# Patient Record
Sex: Female | Born: 1980
Health system: Southern US, Community
[De-identification: ages and names within clinical notes are randomized; demographics above are authoritative.]

## PROBLEM LIST (undated history)

## (undated) DIAGNOSIS — F329 Major depressive disorder, single episode, unspecified: Secondary | ICD-10-CM

## (undated) DIAGNOSIS — F419 Anxiety disorder, unspecified: Secondary | ICD-10-CM

## (undated) DIAGNOSIS — F32A Depression, unspecified: Secondary | ICD-10-CM

## (undated) DIAGNOSIS — R51 Headache: Secondary | ICD-10-CM

## (undated) HISTORY — PX: NO PAST SURGERIES: SHX2092

---
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
## 2001-08-10 ENCOUNTER — Inpatient Hospital Stay (HOSPITAL_COMMUNITY): Admission: AD | Admit: 2001-08-10 | Discharge: 2001-08-12 | Payer: Self-pay

## 2004-01-12 ENCOUNTER — Ambulatory Visit: Payer: Self-pay | Admitting: Family Medicine

## 2006-04-03 ENCOUNTER — Other Ambulatory Visit: Admission: RE | Admit: 2006-04-03 | Discharge: 2006-04-03 | Payer: Self-pay | Admitting: Family Medicine

## 2007-07-09 ENCOUNTER — Other Ambulatory Visit: Admission: RE | Admit: 2007-07-09 | Discharge: 2007-07-09 | Payer: Self-pay | Admitting: Family Medicine

## 2009-04-22 ENCOUNTER — Other Ambulatory Visit: Admission: RE | Admit: 2009-04-22 | Discharge: 2009-04-22 | Payer: Self-pay | Admitting: Family Medicine

## 2010-07-27 ENCOUNTER — Other Ambulatory Visit (HOSPITAL_COMMUNITY): Payer: Self-pay | Admitting: Obstetrics and Gynecology

## 2010-07-29 ENCOUNTER — Ambulatory Visit (HOSPITAL_COMMUNITY)
Admission: RE | Admit: 2010-07-29 | Discharge: 2010-07-29 | Disposition: A | Discharge status: Home / Self Care | Payer: Commercial Indemnity | Source: Ambulatory Visit | Attending: Obstetrics and Gynecology | Admitting: Obstetrics and Gynecology

## 2010-07-29 DIAGNOSIS — Z3689 Encounter for other specified antenatal screening: Secondary | ICD-10-CM | POA: Insufficient documentation

## 2010-07-29 DIAGNOSIS — O365990 Maternal care for other known or suspected poor fetal growth, unspecified trimester, not applicable or unspecified: Secondary | ICD-10-CM | POA: Insufficient documentation

## 2010-09-27 NOTE — Patient Instructions (Addendum)
21 Connie Frazier  09/27/2010   Your procedure is scheduled on:  Thursday  Report to [facility] at 1030 AM.  Call this number if you have problems the morning of surgery: [phone]   Remember:   Do not eat food:After Midnight.  Do not drink clear liquids: 4 Hours before arrival.  Take these medicines the morning of surgery with A SIP OF WATER:NA   Do not wear jewelry, make-up or nail polish.  Do not bring valuables to the hospital.  Contacts, dentures or bridgework may not be worn into surgery.  Leave suitcase in the car. After surgery it may be brought to your room.  For patients admitted to the hospital, checkout time is 11:00 AM the day of discharge.   Patients discharged the day of surgery will not be allowed to drive home.  Name and phone number of your driver:NA  Special Instructions  Please read over the following fact sheets that you were given:MRSA

## 2010-09-28 ENCOUNTER — Encounter (HOSPITAL_COMMUNITY): Payer: Self-pay

## 2010-09-28 ENCOUNTER — Encounter (HOSPITAL_COMMUNITY)
Admission: RE | Admit: 2010-09-28 | Discharge: 2010-09-28 | Disposition: A | Discharge status: Home / Self Care | Payer: Managed Care, Other (non HMO) | Source: Ambulatory Visit | Attending: Obstetrics and Gynecology | Admitting: Obstetrics and Gynecology

## 2010-09-28 HISTORY — DX: Anxiety disorder, unspecified: F41.9

## 2010-09-28 HISTORY — DX: Major depressive disorder, single episode, unspecified: F32.9

## 2010-09-28 HISTORY — DX: Headache: R51

## 2010-09-28 HISTORY — DX: Depression, unspecified: F32.A

## 2010-09-28 LAB — SURGICAL PCR SCREEN: Staphylococcus aureus: NEGATIVE

## 2010-09-28 LAB — CBC
MCV: 74.8 fL — ABNORMAL LOW (ref 78.0–100.0)
Platelets: 156 10*3/uL (ref 150–400)
RBC: 4.97 MIL/uL (ref 3.87–5.11)
RDW: 14.1 % (ref 11.5–15.5)
WBC: 16 10*3/uL — ABNORMAL HIGH (ref 4.0–10.5)

## 2010-09-30 ENCOUNTER — Inpatient Hospital Stay (HOSPITAL_COMMUNITY): Payer: Managed Care, Other (non HMO) | Admitting: Anesthesiology

## 2010-09-30 ENCOUNTER — Encounter (HOSPITAL_COMMUNITY): Payer: Self-pay | Admitting: Anesthesiology

## 2010-09-30 ENCOUNTER — Encounter (HOSPITAL_COMMUNITY): Payer: Self-pay | Admitting: *Deleted

## 2010-09-30 ENCOUNTER — Inpatient Hospital Stay (HOSPITAL_COMMUNITY)
Admission: RE | Admit: 2010-09-30 | Discharge: 2010-10-02 | DRG: 766 | Disposition: A | Discharge status: Home / Self Care | Payer: Managed Care, Other (non HMO) | Source: Ambulatory Visit | Attending: Obstetrics and Gynecology | Admitting: Obstetrics and Gynecology

## 2010-09-30 ENCOUNTER — Encounter (HOSPITAL_COMMUNITY)
Admission: RE | Disposition: A | Discharge status: Home / Self Care | Payer: Self-pay | Source: Ambulatory Visit | Attending: Obstetrics and Gynecology

## 2010-09-30 DIAGNOSIS — Z01818 Encounter for other preprocedural examination: Secondary | ICD-10-CM

## 2010-09-30 DIAGNOSIS — Z01812 Encounter for preprocedural laboratory examination: Secondary | ICD-10-CM

## 2010-09-30 DIAGNOSIS — O321XX0 Maternal care for breech presentation, not applicable or unspecified: Principal | ICD-10-CM | POA: Diagnosis present

## 2010-09-30 LAB — ABO/RH

## 2010-09-30 SURGERY — Surgical Case
Anesthesia: Regional | Wound class: Clean Contaminated

## 2010-09-30 MED ORDER — OXYTOCIN 10 UNIT/ML IJ SOLN
INTRAMUSCULAR | Status: AC
  Filled 2010-09-30: qty 2

## 2010-09-30 MED ORDER — IBUPROFEN 200 MG PO TABS: 200.0000 mg | ORAL_TABLET | Freq: Four times a day (QID) | ORAL | Status: DC | PRN

## 2010-09-30 MED ORDER — OXYTOCIN 20 UNITS IN LACTATED RINGERS INFUSION - SIMPLE
INTRAVENOUS | Status: DC | PRN
  Administered 2010-09-30 (×2): 20 [IU] via INTRAVENOUS

## 2010-09-30 MED ORDER — KETOROLAC TROMETHAMINE 30 MG/ML IJ SOLN
INTRAMUSCULAR | Status: AC
  Administered 2010-09-30: 30 mg via INTRAVENOUS
  Filled 2010-09-30: qty 1

## 2010-09-30 MED ORDER — LACTATED RINGERS IV SOLN
INTRAVENOUS | Status: DC
  Administered 2010-09-30: 20:00:00 via INTRAVENOUS

## 2010-09-30 MED ORDER — KETOROLAC TROMETHAMINE 30 MG/ML IJ SOLN
15.0000 mg | Freq: Once | INTRAMUSCULAR | Status: AC | PRN
  Administered 2010-09-30: 30 mg via INTRAVENOUS

## 2010-09-30 MED ORDER — LACTATED RINGERS IV SOLN
INTRAVENOUS | Status: DC
  Administered 2010-09-30: 1000 mL via INTRAVENOUS
  Administered 2010-09-30 (×2): via INTRAVENOUS
  Administered 2010-09-30: 1000 mL via INTRAVENOUS

## 2010-09-30 MED ORDER — OXYTOCIN 20 UNITS IN LACTATED RINGERS INFUSION - SIMPLE: 125.0000 mL/h | INTRAVENOUS | Status: AC

## 2010-09-30 MED ORDER — SENNOSIDES-DOCUSATE SODIUM 8.6-50 MG PO TABS
1.0000 | ORAL_TABLET | Freq: Every day | ORAL | Status: DC
  Administered 2010-10-01: 1 via ORAL

## 2010-09-30 MED ORDER — MEPERIDINE HCL 25 MG/ML IJ SOLN: 6.2500 mg | INTRAMUSCULAR | Status: DC | PRN

## 2010-09-30 MED ORDER — OXYCODONE-ACETAMINOPHEN 5-325 MG PO TABS
1.0000 | ORAL_TABLET | ORAL | Status: DC | PRN
  Administered 2010-10-01: 2 via ORAL
  Administered 2010-10-01: 1 via ORAL
  Administered 2010-10-01 – 2010-10-02 (×3): 2 via ORAL
  Filled 2010-09-30 (×2): qty 2
  Filled 2010-09-30: qty 1
  Filled 2010-09-30 (×2): qty 2

## 2010-09-30 MED ORDER — PHENYLEPHRINE HCL 10 MG/ML IJ SOLN
INTRAMUSCULAR | Status: DC | PRN
  Administered 2010-09-30 (×2): 80 ug via INTRAVENOUS
  Administered 2010-09-30 (×3): 40 ug via INTRAVENOUS
  Administered 2010-09-30 (×3): 80 ug via INTRAVENOUS
  Administered 2010-09-30: 40 ug via INTRAVENOUS

## 2010-09-30 MED ORDER — EPHEDRINE SULFATE 50 MG/ML IJ SOLN
INTRAMUSCULAR | Status: DC | PRN
  Administered 2010-09-30 (×4): 5 mg via INTRAVENOUS
  Administered 2010-09-30: 10 mg via INTRAVENOUS
  Administered 2010-09-30: 5 mg via INTRAVENOUS

## 2010-09-30 MED ORDER — PRENATAL PLUS 27-1 MG PO TABS
1.0000 | ORAL_TABLET | Freq: Every day | ORAL | Status: DC
  Administered 2010-10-02: 1 via ORAL
  Filled 2010-09-30: qty 1

## 2010-09-30 MED ORDER — CEFAZOLIN SODIUM 1-5 GM-% IV SOLN
INTRAVENOUS | Status: AC
  Administered 2010-09-30: 1 g via INTRAVENOUS
  Filled 2010-09-30: qty 50

## 2010-09-30 MED ORDER — SCOPOLAMINE 1 MG/3DAYS TD PT72
1.0000 | MEDICATED_PATCH | TRANSDERMAL | Status: DC
  Administered 2010-09-30: 1.5 mg via TRANSDERMAL

## 2010-09-30 MED ORDER — MENTHOL 3 MG MT LOZG: 1.0000 | LOZENGE | OROMUCOSAL | Status: DC | PRN

## 2010-09-30 MED ORDER — MORPHINE SULFATE 0.5 MG/ML IJ SOLN
INTRAMUSCULAR | Status: AC
  Filled 2010-09-30: qty 10

## 2010-09-30 MED ORDER — SCOPOLAMINE 1 MG/3DAYS TD PT72
MEDICATED_PATCH | TRANSDERMAL | Status: AC
  Filled 2010-09-30: qty 1

## 2010-09-30 MED ORDER — ONDANSETRON HCL 4 MG/2ML IJ SOLN
INTRAMUSCULAR | Status: AC
  Filled 2010-09-30: qty 2

## 2010-09-30 MED ORDER — FERROUS SULFATE 325 (65 FE) MG PO TABS
325.0000 mg | ORAL_TABLET | Freq: Two times a day (BID) | ORAL | Status: DC
  Administered 2010-10-01 – 2010-10-02 (×2): 325 mg via ORAL
  Filled 2010-09-30 (×2): qty 1

## 2010-09-30 MED ORDER — PHENYLEPHRINE 40 MCG/ML (10ML) SYRINGE FOR IV PUSH (FOR BLOOD PRESSURE SUPPORT)
PREFILLED_SYRINGE | INTRAVENOUS | Status: AC
  Filled 2010-09-30: qty 5

## 2010-09-30 MED ORDER — IBUPROFEN 600 MG PO TABS
600.0000 mg | ORAL_TABLET | Freq: Four times a day (QID) | ORAL | Status: DC
  Administered 2010-10-01 – 2010-10-02 (×4): 600 mg via ORAL
  Filled 2010-09-30 (×5): qty 1

## 2010-09-30 MED ORDER — FENTANYL CITRATE 0.05 MG/ML IJ SOLN
INTRAMUSCULAR | Status: AC
  Filled 2010-09-30: qty 2

## 2010-09-30 MED ORDER — ZOLPIDEM TARTRATE 5 MG PO TABS: 5.0000 mg | ORAL_TABLET | Freq: Every evening | ORAL | Status: DC | PRN

## 2010-09-30 MED ORDER — KETOROLAC TROMETHAMINE 30 MG/ML IJ SOLN
30.0000 mg | Freq: Four times a day (QID) | INTRAMUSCULAR | Status: DC | PRN
  Administered 2010-09-30 – 2010-10-01 (×2): 30 mg via INTRAVENOUS
  Filled 2010-09-30 (×2): qty 1

## 2010-09-30 MED ORDER — SIMETHICONE 80 MG PO CHEW
80.0000 mg | CHEWABLE_TABLET | Freq: Three times a day (TID) | ORAL | Status: DC
  Administered 2010-10-01 – 2010-10-02 (×5): 80 mg via ORAL

## 2010-09-30 MED ORDER — ONDANSETRON HCL 4 MG/2ML IJ SOLN: 4.0000 mg | INTRAMUSCULAR | Status: DC | PRN

## 2010-09-30 MED ORDER — HYDROMORPHONE HCL 1 MG/ML IJ SOLN
0.2500 mg | INTRAMUSCULAR | Status: DC | PRN
  Administered 2010-09-30: 0.5 mg via INTRAVENOUS

## 2010-09-30 MED ORDER — HYDROMORPHONE HCL 1 MG/ML IJ SOLN
INTRAMUSCULAR | Status: AC
  Administered 2010-09-30: 0.5 mg via INTRAVENOUS
  Filled 2010-09-30: qty 1

## 2010-09-30 MED ORDER — WITCH HAZEL-GLYCERIN EX PADS: MEDICATED_PAD | CUTANEOUS | Status: DC | PRN

## 2010-09-30 MED ORDER — SIMETHICONE 80 MG PO CHEW: 80.0000 mg | CHEWABLE_TABLET | ORAL | Status: DC | PRN

## 2010-09-30 MED ORDER — DIPHENHYDRAMINE HCL 25 MG PO CAPS: 25.0000 mg | ORAL_CAPSULE | Freq: Four times a day (QID) | ORAL | Status: DC | PRN

## 2010-09-30 MED ORDER — ONDANSETRON HCL 4 MG/2ML IJ SOLN: 4.0000 mg | Freq: Once | INTRAMUSCULAR | Status: DC | PRN

## 2010-09-30 MED ORDER — ONDANSETRON HCL 4 MG PO TABS: 4.0000 mg | ORAL_TABLET | ORAL | Status: DC | PRN

## 2010-09-30 MED ORDER — ONDANSETRON HCL 4 MG/2ML IJ SOLN
INTRAMUSCULAR | Status: DC | PRN
  Administered 2010-09-30: 4 mg via INTRAVENOUS

## 2010-09-30 SURGICAL SUPPLY — 38 items
APL SKNCLS STERI-STRIP NONHPOA (GAUZE/BANDAGES/DRESSINGS) ×1
BENZOIN TINCTURE PRP APPL 2/3 (GAUZE/BANDAGES/DRESSINGS) ×2 IMPLANT
CLOTH BEACON ORANGE TIMEOUT ST (SAFETY) ×2 IMPLANT
CONTAINER PREFILL 10% NBF 15ML (MISCELLANEOUS) IMPLANT
DRAPE UTILITY XL STRL (DRAPES) ×2 IMPLANT
DRESSING TELFA 8X3 (GAUZE/BANDAGES/DRESSINGS) ×2 IMPLANT
ELECT REM PT RETURN 9FT ADLT (ELECTROSURGICAL) ×2
ELECTRODE REM PT RTRN 9FT ADLT (ELECTROSURGICAL) ×1 IMPLANT
EXTRACTOR VACUUM M CUP 4 TUBE (SUCTIONS) IMPLANT
GAUZE SPONGE 4X4 12PLY STRL LF (GAUZE/BANDAGES/DRESSINGS) ×2 IMPLANT
GLOVE BIOGEL M 6.5 STRL (GLOVE) ×2 IMPLANT
GLOVE BIOGEL PI IND STRL 6.5 (GLOVE) ×2 IMPLANT
GLOVE BIOGEL PI INDICATOR 6.5 (GLOVE) ×2
GOWN PREVENTION PLUS LG XLONG (DISPOSABLE) ×2 IMPLANT
GOWN PREVENTION PLUS XLARGE (GOWN DISPOSABLE) ×2 IMPLANT
KIT ABG SYR 3ML LUER SLIP (SYRINGE) IMPLANT
NDL HYPO 25X5/8 SAFETYGLIDE (NEEDLE) IMPLANT
NEEDLE HYPO 25X5/8 SAFETYGLIDE (NEEDLE) IMPLANT
NS IRRIG 1000ML POUR BTL (IV SOLUTION) ×2 IMPLANT
PACK C SECTION WH (CUSTOM PROCEDURE TRAY) ×2 IMPLANT
PAD ABD 7.5X8 STRL (GAUZE/BANDAGES/DRESSINGS) ×2 IMPLANT
RTRCTR C-SECT PINK 25CM LRG (MISCELLANEOUS) ×1 IMPLANT
RTRCTR C-SECT PINK 34CM XLRG (MISCELLANEOUS) IMPLANT
SLEEVE SCD COMPRESS KNEE MED (MISCELLANEOUS) IMPLANT
STAPLER VISISTAT 35W (STAPLE) IMPLANT
STRIP CLOSURE SKIN 1/2X4 (GAUZE/BANDAGES/DRESSINGS) ×2 IMPLANT
SUT PDS AB 0 CT1 27 (SUTURE) ×4 IMPLANT
SUT PLAIN 0 NONE (SUTURE) IMPLANT
SUT VIC AB 0 CTX 36 (SUTURE) ×6
SUT VIC AB 0 CTX36XBRD ANBCTRL (SUTURE) ×3 IMPLANT
SUT VIC AB 2-0 CT1 27 (SUTURE) ×2
SUT VIC AB 2-0 CT1 TAPERPNT 27 (SUTURE) ×1 IMPLANT
SUT VIC AB 3-0 SH 27 (SUTURE)
SUT VIC AB 3-0 SH 27X BRD (SUTURE) IMPLANT
SUT VIC AB 4-0 KS 27 (SUTURE) ×2 IMPLANT
TOWEL OR 17X24 6PK STRL BLUE (TOWEL DISPOSABLE) ×4 IMPLANT
TRAY FOLEY CATH 14FR (SET/KITS/TRAYS/PACK) ×2 IMPLANT
WATER STERILE IRR 1000ML POUR (IV SOLUTION) ×2 IMPLANT

## 2010-09-30 NOTE — Anesthesia Preprocedure Evaluation (Signed)
Anesthesia Evaluation  Name, MR# and DOB Patient awake  General Assessment Comment  Reviewed: Allergy & Precautions, H&P  and Patient's Chart, lab work & pertinent test results  Airway Mallampati: II TM Distance: >3 FB Neck ROM: full    Dental No notable dental hx    Pulmonaryneg pulmonary ROS      pulmonary exam normal   Cardiovascular regular Normal   Neuro/PsychNegative Psych ROS  GI/Hepatic/Renal negative GI ROS, negative Liver ROS, and negative Renal ROS (+)       Endo/Other  Negative Endocrine ROS (+)   Abdominal   Musculoskeletal  Hematology negative hematology ROS (+)   Peds  Reproductive/Obstetrics (+) Pregnancy   Anesthesia Other Findings             Anesthesia Physical Anesthesia Plan  ASA: II  Anesthesia Plan: Spinal   Post-op Pain Management:    Induction:   Airway Management Planned:   Additional Equipment:   Intra-op Plan:   Post-operative Plan:   Informed Consent: I have reviewed the patients History and Physical, chart, labs and discussed the procedure including the risks, benefits and alternatives for the proposed anesthesia with the patient or authorized representative who has indicated his/her understanding and acceptance.     Plan Discussed with: CRNA  Anesthesia Plan Comments:         Anesthesia Quick Evaluation

## 2010-09-30 NOTE — Transfer of Care (Signed)
Immediate Anesthesia Transfer of Care Note  Patient: Connie Frazier  Procedure(s) Performed:  CESAREAN SECTION - Primary Cesarean Section  Patient Location: PACU  Anesthesia Type: Spinal  Level of Consciousness: awake, alert  and oriented  Airway & Oxygen Therapy: Patient Spontanous Breathing  Post-op Asssessment:  No anesthesia complications  Post vital signs: Reviewed and stable  Complications: No apparent anesthesia complications

## 2010-09-30 NOTE — Consult Note (Signed)
Delivery Note   09/30/2010  1:39 PM  Requested by Dr. Richardson Dopp   to attend this C-section for breech presentation.    Born to a 29  y/o G2P1 mother with [facility]  and negative screens.          Prenatal problems included breech presentation.  AROM at delivery with clear fluid.             The c/section delivery was uncomplicated otherwise.  Infant handed to Neo crying.  Dried, bulb suctioned and kept warm.  APGAR 9 and 9.  Care transfer to Dr. Carmon Ginsberg.    Chales Abrahams V.T. Axell Trigueros, MD Neonatologist

## 2010-09-30 NOTE — Op Note (Signed)
Cesarean Section Procedure Note  Indications: malpresentation: breech  Pre-operative Diagnosis: 39 week 1 day pregnancy.  Post-operative Diagnosis: same  Surgeon: Jessee Avers.   Assistants: none  Anesthesia: Spinal anesthesia  ASA Class: 2  Procedure Details  The patient was seen in the Holding Room. The risks, benefits, complications, treatment options, and expected outcomes were discussed with the patient.  The patient concurred with the proposed plan, giving informed consent.  The site of surgery properly noted/marked. The patient was taken to Operating Room # 9 , identified as Cathlean Marseilles and the procedure verified as C-Section Delivery. A Time Out was held and the above information confirmed.  After induction of anesthesia, the patient was draped and prepped in the usual sterile manner. A Pfannenstiel incision was made and carried down through the subcutaneous tissue to the fascia. Fascial incision was made and extended transversely. The fascia was separated from the underlying rectus tissue superiorly and inferiorly. The peritoneum was identified and entered. Peritoneal incision was extended longitudinally. The utero-vesical peritoneal reflection was incised transversely and the bladder flap was bluntly freed from the lower uterine segment. A low transverse uterine incision was made. Delivered from breech  presentation was a female infant with Apgar scores of 9 at one minute and 9 at five minutes. After the umbilical cord was clamped and cut cord blood was obtained for evaluation. The placenta was removed intact and appeared normal. The uterine outline, tubes and ovaries appeared normal. The uterine incision was closed with running locked sutures of 0 Vicryl.  A second layer of suture was used and Hemostasis was observed. Lavage was carried out until clear. The fascia was then reapproximated with running sutures of 0 PDS The skin was reapproximated with 4-0 vicryl  Instrument, sponge, and  needle counts were correct prior the abdominal closure and at the conclusion of the case.   Findings: Female infant breech presentation apgars 9/9 .Marland Kitchen Normal tubes and ovaries   Estimated Blood Loss:  400         Drains: none         Total IV Fluids: per anesthesia ml         Specimens: placenta to labor and delivery          Implants: foley         Complications:  None; patient tolerated the procedure well.         Disposition: PACU - hemodynamically stable.         Condition: stable  Attending Attestation: I performed the procedure.

## 2010-09-30 NOTE — Anesthesia Procedure Notes (Addendum)
Spinal Block  Patient location during procedure: OR Start time: 09/30/2010 12:50 PM End time: 09/30/2010 1:02 PM Staffing Anesthesiologist: Sandrea Hughs Performed by: anesthesiologist  Preanesthetic Checklist Completed: patient identified, site marked, surgical consent, pre-op evaluation, timeout performed, IV checked, risks and benefits discussed and monitors and equipment checked Spinal Block Patient position: sitting Prep: DuraPrep Patient monitoring: heart rate, cardiac monitor, continuous pulse ox and blood pressure Approach: midline Location: L3-4 Injection technique: single-shot Needle Needle type: Sprotte  Needle gauge: 24 G Needle length: 9 cm Needle insertion depth: 5 cm Assessment Sensory level: T4 Events: paresthesia Additional Notes It took multiple attempts due to scoliosis. Pt had a left leg paresthesia x 2. I used 10.5 mg of bupivicaine, of fentanyl, and 0.1 mg of duramorph

## 2010-09-30 NOTE — Anesthesia Postprocedure Evaluation (Signed)
Vital signs stable Patient alert Pain and nausea are controlled No apparent anesthetic complications No follow up care needed Pt may be d/c when nm fxn returns 

## 2010-10-01 LAB — CBC
HCT: 29.6 % — ABNORMAL LOW (ref 36.0–46.0)
Hemoglobin: 9.2 g/dL — ABNORMAL LOW (ref 12.0–15.0)
MCH: 23.2 pg — ABNORMAL LOW (ref 26.0–34.0)
MCV: 74.6 fL — ABNORMAL LOW (ref 78.0–100.0)
Platelets: 148 10*3/uL — ABNORMAL LOW (ref 150–400)
RBC: 3.97 MIL/uL (ref 3.87–5.11)

## 2010-10-01 NOTE — Progress Notes (Signed)
Baby acts as if interested in latching, but would then fall asleep when presented with nipple.  Time for baby to feed, so Mom taught how to hand-express and spoon-feed.  Mom easily hand-expresses.  Baby sleeping STS on Mom's chest.  Mom aware that she should attempt to feed again within a couple of hours & if baby doesn't feed, hand-express and spoon-feed a little.  Connie Frazier [place]

## 2010-10-01 NOTE — Progress Notes (Signed)
Subjective: Postpartum Day 1: Cesarean Delivery Patient reports tolerating PO, + flatus and no problems voiding.    Objective: Vital signs in last 24 hours: Temp:  [97.1 F (36.2 C)-98.5 F (36.9 C)] 98 F (36.7 C) (07/27 1406) Pulse Rate:  [76-103] 80  (07/27 1406) Resp:  [16-20] 18  (07/27 1040) BP: (96-106)/(53-68) 106/68 mmHg (07/27 1406) SpO2:  [94 %-99 %] 95 % (07/27 1406)  Physical Exam:  General: alert and cooperative Lochia: appropriate Uterine Fundus: firm Incision: bandage clean dry and intact  DVT Evaluation: No evidence of DVT seen on physical exam.   Basename 10/01/10 0505  HGB 9.2*  HCT 29.6*    Assessment/Plan: Status post Cesarean section. Doing well postoperatively.  Continue current care.  Connie Frazier J. 10/01/2010, 4:56 PM

## 2010-10-02 MED ORDER — PRENATAL PLUS 27-1 MG PO TABS: 1.0000 | ORAL_TABLET | Freq: Every day | ORAL | Status: DC

## 2010-10-02 MED ORDER — OXYTOCIN 20 UNITS IN LACTATED RINGERS INFUSION - SIMPLE: 125.0000 mL/h | INTRAVENOUS | Status: DC | PRN

## 2010-10-02 MED ORDER — OXYCODONE-ACETAMINOPHEN 5-325 MG PO TABS: 1.0000 | ORAL_TABLET | ORAL | Status: DC | PRN

## 2010-10-02 MED ORDER — WITCH HAZEL-GLYCERIN EX PADS: MEDICATED_PAD | CUTANEOUS | Status: DC | PRN

## 2010-10-02 MED ORDER — MEASLES, MUMPS & RUBELLA VAC ~~LOC~~ INJ: 0.5000 mL | INJECTION | Freq: Once | SUBCUTANEOUS | Status: DC

## 2010-10-02 MED ORDER — LANOLIN HYDROUS EX OINT: TOPICAL_OINTMENT | CUTANEOUS | Status: DC

## 2010-10-02 MED ORDER — ONDANSETRON HCL 4 MG PO TABS: 4.0000 mg | ORAL_TABLET | ORAL | Status: DC | PRN

## 2010-10-02 MED ORDER — MEDROXYPROGESTERONE ACETATE 150 MG/ML IM SUSP
150.0000 mg | INTRAMUSCULAR | Status: DC | PRN
Start: 2010-10-02 — End: 2010-10-02

## 2010-10-02 MED ORDER — SIMETHICONE 80 MG PO CHEW
80.0000 mg | CHEWABLE_TABLET | ORAL | Status: DC | PRN
Start: 2010-10-02 — End: 2010-10-02

## 2010-10-02 MED ORDER — IBUPROFEN 600 MG PO TABS: 600.0000 mg | ORAL_TABLET | Freq: Four times a day (QID) | ORAL | Status: DC

## 2010-10-02 MED ORDER — BENZOCAINE-MENTHOL 20-0.5 % EX AERO
1.0000 | INHALATION_SPRAY | CUTANEOUS | Status: DC | PRN
Start: 2010-10-02 — End: 2010-10-02

## 2010-10-02 MED ORDER — DIPHENHYDRAMINE HCL 25 MG PO CAPS: 25.0000 mg | ORAL_CAPSULE | Freq: Four times a day (QID) | ORAL | Status: DC | PRN

## 2010-10-02 MED ORDER — ONDANSETRON HCL 4 MG/2ML IJ SOLN: 4.0000 mg | INTRAMUSCULAR | Status: DC | PRN

## 2010-10-02 MED ORDER — ZOLPIDEM TARTRATE 5 MG PO TABS: 5.0000 mg | ORAL_TABLET | Freq: Every evening | ORAL | Status: DC | PRN

## 2010-10-02 MED ORDER — SENNOSIDES-DOCUSATE SODIUM 8.6-50 MG PO TABS: 1.0000 | ORAL_TABLET | Freq: Every day | ORAL | Status: DC

## 2010-10-02 MED ORDER — TETANUS-DIPHTH-ACELL PERTUSSIS 5-2.5-18.5 LF-MCG/0.5 IM SUSP: 0.5000 mL | Freq: Once | INTRAMUSCULAR | Status: DC

## 2010-10-02 NOTE — Progress Notes (Signed)
Subjective: Postpartum Day 2: Cesarean Delivery Patient reports nausea, + flatus and no problems voiding.    Objective: Vital signs in last 24 hours: Temp:  [97.6 F (36.4 C)-98 F (36.7 C)] 97.6 F (36.4 C) (07/28 4782) Pulse Rate:  [80-87] 87  (07/28 0633) Resp:  [18] 18  (07/28 0633) BP: (97-106)/(54-68) 100/59 mmHg (07/28 0633) SpO2:  [95 %] 95 % (07/27 1406)  Physical Exam:  General: alert and no distress Lochia: appropriate Uterine Fundus: firm Incision: healing well, no significant drainage DVT Evaluation: No evidence of DVT seen on physical exam.   Basename 10/01/10 0505  HGB 9.2*  HCT 29.6*    Assessment/Plan: Status post Cesarean section. Doing well postoperatively.  Discharge home with standard precautions and return to clinic in 4-6 weeks.  Emmer Lillibridge E 10/02/2010, 1:56 PM

## 2010-10-02 NOTE — Discharge Summary (Signed)
Obstetric Discharge Summary Reason for Admission: term, breech Prenatal Procedures: none Intrapartum Procedures: cesarean: low cervical, transverse Postpartum Procedures: none Complications-Operative and Postpartum: none  Hemoglobin  Date Value Range Status  10/01/2010 9.2* 12.0-15.0 (g/dL) Final     HCT  Date Value Range Status  10/01/2010 29.6* 36.0-46.0 (%) Final    Discharge Diagnoses: Term Pregnancy-delivered  Discharge Information: Date: 10/02/2010 Activity: unrestricted and pelvic rest Diet: routine Medications: Tylenol #3 and Iron Condition: stable Instructions: refer to practice specific booklet Discharge to: home   Newborn Data: Live born  Information for the patient's newborn:  Irmgard, Rampersaud [[id number]]  female ; APGAR , ; weight ;  Home with mother.  Fortino Sic 10/02/2010, 1:59 PM

## 2010-10-21 ENCOUNTER — Encounter (HOSPITAL_COMMUNITY): Payer: Self-pay | Admitting: Obstetrics and Gynecology

## 2010-11-19 ENCOUNTER — Encounter (HOSPITAL_COMMUNITY): Payer: Self-pay | Admitting: *Deleted

## 2011-01-03 ENCOUNTER — Other Ambulatory Visit: Payer: Self-pay | Admitting: Obstetrics and Gynecology

## 2011-01-03 ENCOUNTER — Other Ambulatory Visit (HOSPITAL_COMMUNITY)
Admission: RE | Admit: 2011-01-03 | Discharge: 2011-01-03 | Disposition: A | Discharge status: Home / Self Care | Payer: Managed Care, Other (non HMO) | Source: Ambulatory Visit | Attending: Obstetrics and Gynecology | Admitting: Obstetrics and Gynecology

## 2011-01-03 DIAGNOSIS — Z01419 Encounter for gynecological examination (general) (routine) without abnormal findings: Secondary | ICD-10-CM | POA: Insufficient documentation

## 2011-05-16 ENCOUNTER — Other Ambulatory Visit: Payer: Self-pay | Admitting: Family Medicine

## 2011-05-16 DIAGNOSIS — R52 Pain, unspecified: Secondary | ICD-10-CM

## 2011-05-16 DIAGNOSIS — H539 Unspecified visual disturbance: Secondary | ICD-10-CM

## 2011-05-23 ENCOUNTER — Ambulatory Visit
Admission: RE | Admit: 2011-05-23 | Discharge: 2011-05-23 | Disposition: A | Discharge status: Home / Self Care | Payer: No Typology Code available for payment source | Source: Ambulatory Visit | Attending: Family Medicine | Admitting: Family Medicine

## 2011-05-23 DIAGNOSIS — R52 Pain, unspecified: Secondary | ICD-10-CM

## 2011-05-23 DIAGNOSIS — H539 Unspecified visual disturbance: Secondary | ICD-10-CM

## 2012-01-31 ENCOUNTER — Other Ambulatory Visit (HOSPITAL_COMMUNITY)
Admission: RE | Admit: 2012-01-31 | Discharge: 2012-01-31 | Disposition: A | Discharge status: Home / Self Care | Payer: Managed Care, Other (non HMO) | Source: Ambulatory Visit | Attending: Obstetrics and Gynecology | Admitting: Obstetrics and Gynecology

## 2012-01-31 ENCOUNTER — Other Ambulatory Visit: Payer: Self-pay | Admitting: Obstetrics and Gynecology

## 2012-01-31 DIAGNOSIS — Z01419 Encounter for gynecological examination (general) (routine) without abnormal findings: Secondary | ICD-10-CM | POA: Insufficient documentation

## 2013-07-27 ENCOUNTER — Emergency Department (HOSPITAL_COMMUNITY)
Admission: EM | Admit: 2013-07-27 | Discharge: 2013-07-27 | Disposition: A | Discharge status: Home / Self Care | Payer: BC Managed Care – PPO | Source: Home / Self Care | Attending: Family Medicine | Admitting: Family Medicine

## 2013-07-27 ENCOUNTER — Encounter (HOSPITAL_COMMUNITY): Payer: Self-pay | Admitting: Emergency Medicine

## 2013-07-27 DIAGNOSIS — N390 Urinary tract infection, site not specified: Secondary | ICD-10-CM

## 2013-07-27 LAB — POCT URINALYSIS DIP (DEVICE)
BILIRUBIN URINE: NEGATIVE
GLUCOSE, UA: NEGATIVE mg/dL
KETONES UR: NEGATIVE mg/dL
NITRITE: NEGATIVE
PH: 6.5 (ref 5.0–8.0)
Protein, ur: 100 mg/dL — AB
Specific Gravity, Urine: 1.025 (ref 1.005–1.030)
Urobilinogen, UA: 0.2 mg/dL (ref 0.0–1.0)

## 2013-07-27 LAB — POCT PREGNANCY, URINE: Preg Test, Ur: NEGATIVE

## 2013-07-27 MED ORDER — NITROFURANTOIN MONOHYD MACRO 100 MG PO CAPS: 100.0000 mg | ORAL_CAPSULE | Freq: Two times a day (BID) | ORAL | Status: AC

## 2013-07-27 NOTE — ED Notes (Signed)
Patient complains of bladder pain and discomfort; painful urination and hematuria that started on Tuesday. Denies f/c

## 2013-07-27 NOTE — ED Provider Notes (Signed)
CSN: [id number]     Arrival date & time 07/27/13  1218 History   First MD Initiated Contact with Patient 07/27/13 1331     Chief Complaint  Patient presents with  . Urinary Tract Infection   (Consider location/radiation/quality/duration/timing/severity/associated sxs/prior Treatment) HPI Comments: No N/V/D/c, fever/chills.  Mild suprapubic abdominal discomfort. PCP: Dr. Erby Pian  Patient is a 33 y.o. female presenting with urinary tract infection. The history is provided by the patient.  Urinary Tract Infection This is a new problem. Episode onset: symptoms began 5 days ago. The problem occurs constantly. The problem has been gradually worsening. Associated symptoms comments: +dysuria, hematuria, urgency and frequency.    Past Medical History  Diagnosis Date  . Headache(784.0)   . Depression   . Anxiety    Past Surgical History  Procedure Laterality Date  . No past surgeries    . Cesarean section  09/30/2010    Procedure: CESAREAN SECTION;  Surgeon: Jessee Avers;  Location: [facility];  Service: Gynecology;  Laterality: N/A;  Primary Cesarean Section   No family history on file. History  Substance Use Topics  . Smoking status: Never Smoker   . Smokeless tobacco: Not on file  . Alcohol Use: Yes     Comment: rarely   OB History   Grav Para Term Preterm Abortions TAB SAB Ect Mult Living   2 2 2       2      Review of Systems  Constitutional: Negative.   HENT: Negative.   Respiratory: Negative.   Cardiovascular: Negative.   Gastrointestinal: Negative.   Endocrine: Positive for polyuria. Negative for polydipsia and polyphagia.  Genitourinary: Positive for dysuria, urgency, frequency, hematuria and pelvic pain. Negative for flank pain, decreased urine volume, vaginal bleeding, vaginal discharge, difficulty urinating, genital sores, vaginal pain, menstrual problem and dyspareunia.  Musculoskeletal: Negative for back pain.  Skin: Negative.     Allergies  Review of patient's  allergies indicates no known allergies.  Home Medications   Prior to Admission medications   Not on File   BP 119/64  Pulse 88  Temp(Src) 98.3 F (36.8 C) (Oral)  Resp 18  SpO2 100% Physical Exam  Nursing note and vitals reviewed. Constitutional: She is oriented to person, place, and time. Vital signs are normal. She appears well-developed and well-nourished. She is cooperative. She does not appear ill. No distress.  HENT:  Head: Normocephalic and atraumatic.  Eyes: Conjunctivae are normal.  Neck: Full passive range of motion without pain and phonation normal.  Cardiovascular: Normal rate and regular rhythm.   No murmur heard. Pulmonary/Chest: Effort normal and breath sounds normal. No respiratory distress.  Abdominal: Normal appearance and bowel sounds are normal. She exhibits no mass. There is tenderness in the suprapubic area. There is no rigidity, no rebound, no guarding and no CVA tenderness.  Musculoskeletal: Normal range of motion.  Neurological: She is alert and oriented to person, place, and time.  Skin: Skin is warm and dry.  Psychiatric: She has a normal mood and affect. Her speech is normal and behavior is normal. Thought content normal.    ED Course  Procedures (including critical care time) Labs Review Labs Reviewed  POCT URINALYSIS DIP (DEVICE) - Abnormal; Notable for the following:    Hgb urine dipstick LARGE (*)    Protein, ur 100 (*)    Leukocytes, UA SMALL (*)    All other components within normal limits  POCT PREGNANCY, URINE    Imaging Review No results found.  MDM   1. UTI (lower urinary tract infection)    Will send urine for C&S and begin treatment with Macrobid. PCP follow up if no improvement.     Jess BartersJennifer Lee VictoriaPresson, [place]PA 07/27/13 1410

## 2013-07-27 NOTE — Discharge Instructions (Signed)
Urinary Tract Infection  Urinary tract infections (UTIs) can develop anywhere along your urinary tract. Your urinary tract is your body's drainage system for removing wastes and extra water. Your urinary tract includes two kidneys, two ureters, a bladder, and a urethra. Your kidneys are a pair of bean-shaped organs. Each kidney is about the size of your fist. They are located below your ribs, one on each side of your spine.  CAUSES  Infections are caused by microbes, which are microscopic organisms, including fungi, viruses, and bacteria. These organisms are so small that they can only be seen through a microscope. Bacteria are the microbes that most commonly cause UTIs.  SYMPTOMS   Symptoms of UTIs may vary by age and gender of the patient and by the location of the infection. Symptoms in young women typically include a frequent and intense urge to urinate and a painful, burning feeling in the bladder or urethra during urination. Older women and men are more likely to be tired, shaky, and weak and have muscle aches and abdominal pain. A fever may mean the infection is in your kidneys. Other symptoms of a kidney infection include pain in your back or sides below the ribs, nausea, and vomiting.  DIAGNOSIS  To diagnose a UTI, your caregiver will ask you about your symptoms. Your caregiver also will ask to provide a urine sample. The urine sample will be tested for bacteria and white blood cells. White blood cells are made by your body to help fight infection.  TREATMENT   Typically, UTIs can be treated with medication. Because most UTIs are caused by a bacterial infection, they usually can be treated with the use of antibiotics. The choice of antibiotic and length of treatment depend on your symptoms and the type of bacteria causing your infection.  HOME CARE INSTRUCTIONS   If you were prescribed antibiotics, take them exactly as your caregiver instructs you. Finish the medication even if you feel better after you  have only taken some of the medication.   Drink enough water and fluids to keep your urine clear or pale yellow.   Avoid caffeine, tea, and carbonated beverages. They tend to irritate your bladder.   Empty your bladder often. Avoid holding urine for long periods of time.   Empty your bladder before and after sexual intercourse.   After a bowel movement, women should cleanse from front to back. Use each tissue only once.  SEEK MEDICAL CARE IF:    You have back pain.   You develop a fever.   Your symptoms do not begin to resolve within 3 days.  SEEK IMMEDIATE MEDICAL CARE IF:    You have severe back pain or lower abdominal pain.   You develop chills.   You have nausea or vomiting.   You have continued burning or discomfort with urination.  MAKE SURE YOU:    Understand these instructions.   Will watch your condition.   Will get help right away if you are not doing well or get worse.  Document Released: 12/01/2004 Document Revised: 08/23/2011 Document Reviewed: 04/01/2011  ExitCare Patient Information 2014 [employer].

## 2013-07-27 NOTE — ED Notes (Signed)
Bed: UC10 Expected date:  Expected time:  Means of arrival:  Comments: 

## 2013-07-27 NOTE — ED Provider Notes (Signed)
Medical screening examination/treatment/procedure(s) were performed by resident physician or non-physician practitioner and as supervising physician I was immediately available for consultation/collaboration.   KINDL,JAMES DOUGLAS MD.   James D Kindl, MD 07/27/13 1959 

## 2013-10-22 ENCOUNTER — Other Ambulatory Visit: Payer: Self-pay | Admitting: Family Medicine

## 2013-10-22 DIAGNOSIS — R42 Dizziness and giddiness: Secondary | ICD-10-CM

## 2013-10-24 ENCOUNTER — Other Ambulatory Visit: Payer: BC Managed Care – PPO

## 2014-01-06 ENCOUNTER — Encounter (HOSPITAL_COMMUNITY): Payer: Self-pay | Admitting: Emergency Medicine

## 2015-01-08 ENCOUNTER — Other Ambulatory Visit: Payer: Self-pay | Admitting: Family Medicine

## 2015-01-08 ENCOUNTER — Other Ambulatory Visit (HOSPITAL_COMMUNITY)
Admission: RE | Admit: 2015-01-08 | Discharge: 2015-01-08 | Disposition: A | Discharge status: Home / Self Care | Payer: BLUE CROSS/BLUE SHIELD | Source: Ambulatory Visit | Attending: Family Medicine | Admitting: Family Medicine

## 2015-01-08 DIAGNOSIS — Z01419 Encounter for gynecological examination (general) (routine) without abnormal findings: Secondary | ICD-10-CM | POA: Diagnosis present

## 2015-01-12 LAB — CYTOLOGY - PAP

## 2015-06-25 DIAGNOSIS — F418 Other specified anxiety disorders: Secondary | ICD-10-CM | POA: Diagnosis not present

## 2015-08-20 DIAGNOSIS — Z7189 Other specified counseling: Secondary | ICD-10-CM

## 2015-08-27 DIAGNOSIS — Z638 Other specified problems related to primary support group: Secondary | ICD-10-CM

## 2015-08-28 NOTE — Congregational Nurse Program (Signed)
Congregational Nurse Program Note  Date of Encounter: 08/27/2015  Past Medical History: Past Medical History  Diagnosis Date  . Headache(784.0)   . Depression   . Anxiety     Encounter Details:     CNP Questionnaire - 08/28/15 0031    Patient Demographics   Is this a new or existing patient? Existing   Patient is considered a/an Refugee   Race Asian   Patient Assistance   [facility]   Patient's financial/insurance status Low Income;[facility]   Uninsured Patient Yes   Interventions Not Applicable   Patient referred to apply for the following financial assistance Not Applicable   Food insecurities addressed Not Applicable   Transportation assistance No   Assistance securing medications No   Educational health offerings Behavioral health;Navigating the healthcare system;Spiritual care   Encounter Details   Primary purpose of visit Education/Health Concerns;Spiritual Care/Support Visit   Was an Emergency Department visit averted? Not Applicable   Does patient have a medical provider? No   Patient referred to Not Applicable   Was a mental health screening completed? (GAINS tool) No   Does patient have dental issues? No   Does patient have vision issues? No   Does your patient have an abnormal blood pressure today? No   Since previous encounter, have you referred patient for abnormal blood pressure that resulted in a new diagnosis or medication change? No   Does your patient have an abnormal blood glucose today? No   Since previous encounter, have you referred patient for abnormal blood glucose that resulted in a new diagnosis or medication change? No   Was there a life-saving intervention made? 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  She was praised for initiative, energy, leadership with the children.  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  Feels really good about self and wants help to do good job.  Phone call made to [facility] to get help with literature.  They will get a church to donate some items to help.  Client also in need of equipment and childrens furniture.  Will try to find some help for her.  She is still on meds for depression and feeling better.

## 2015-09-07 DIAGNOSIS — Z7189 Other specified counseling: Secondary | ICD-10-CM

## 2015-10-08 NOTE — Congregational Nurse Program (Signed)
Congregational Nurse Program Note  Date of Encounter: 09/07/2015  Past Medical History: Past Medical History:  Diagnosis Date  . Anxiety   . Depression   . CLEXNTZG(017.4)     Encounter Details:   Made contact with donor who supplied art and toy supplies for client's project at church.  Picked up supplies of literature for her as well.  Prayed with donor for client.

## 2015-10-15 DIAGNOSIS — Z7189 Other specified counseling: Secondary | ICD-10-CM

## 2015-11-02 DIAGNOSIS — N390 Urinary tract infection, site not specified: Secondary | ICD-10-CM | POA: Diagnosis not present

## 2015-11-02 DIAGNOSIS — R300 Dysuria: Secondary | ICD-10-CM | POA: Diagnosis not present

## 2015-11-05 DIAGNOSIS — Z7189 Other specified counseling: Secondary | ICD-10-CM

## 2015-11-09 NOTE — Congregational Nurse Program (Signed)
Congregational Nurse Program Note  Date of Encounter: 08/20/2015  Past Medical History: Past Medical History:  Diagnosis Date  . Anxiety   . Depression   . ZOXWRUEA(540.9Headache(784.0)     Encounter Details:   Working with client to address her depression.,  Client is now focusing on self and her church.  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

## 2015-11-09 NOTE — Congregational Nurse Program (Signed)
Congregational Nurse Program Note  Date of Encounter: 11/05/2015  Past Medical History: Past Medical History:  Diagnosis Date  . Anxiety   . Depression   . WUJWJXBJ(478.2Headache(784.0)     Encounter Details:     CNP Questionnaire - 11/05/15 2327      Patient Demographics   Is this a new or existing patient? Existing   Patient is considered a/an Refugee   Race Asian     Patient Assistance   [facility]   Patient's financial/insurance status Low Income;[facility]   Uninsured Patient Yes   Interventions Not Applicable   Patient referred to apply for the following financial assistance Not Applicable   Food insecurities addressed Not Applicable   Transportation assistance No   Assistance securing medications No   Educational health offerings Behavioral health;Navigating the healthcare system;Spiritual care     Encounter Details   Primary purpose of visit Education/Health Concerns;Spiritual Care/Support Visit   Was an Emergency Department visit averted? Not Applicable   Does patient have a medical provider? Yes   [facility]comment[facility]   Was a mental health screening completed? (GAINS tool) No   Does patient have dental issues? No   Does patient have vision issues? No   Does your patient have an abnormal blood pressure today? No   Since previous encounter, have you referred patient for abnormal blood pressure that resulted in a new diagnosis or medication change? No   Does your patient have an abnormal blood glucose today? No   Since previous encounter, have you referred patient for abnormal blood glucose that resulted in a new diagnosis or medication change? No   Was there a life-saving intervention made? No      Spoke briefly with client, she got a few uniforms for her child.  She is doing ok.  She feels good and looking forward to getting ged and on to college.  She has made a good deal  of progress.  F/u prn.

## 2015-11-19 DIAGNOSIS — Z7189 Other specified counseling: Secondary | ICD-10-CM

## 2015-12-14 NOTE — Congregational Nurse Program (Signed)
Congregational Nurse Program Note  Date of Encounter: 11/19/2015  Past Medical History: Past Medical History:  Diagnosis Date  . Anxiety   . Depression   . ZDGUYQIH(474.2Headache(784.0)     Encounter Details:     CNP Questionnaire - 11/19/15 2213      Patient Demographics   Is this a new or existing patient? Existing   Patient is considered a/an Refugee   Race Asian     Patient Assistance   [facility]   Patient's financial/insurance status Low Income;[facility]   Uninsured Patient Yes   Interventions Not Applicable   Patient referred to apply for the following financial assistance Not Applicable   Food insecurities addressed Not Applicable   Transportation assistance No   Assistance securing medications No   Educational health offerings Behavioral health;Navigating the healthcare system;Spiritual care     Encounter Details   Primary purpose of visit Education/Health Concerns;Spiritual Care/Support Visit   Was an Emergency Department visit averted? Not Applicable   Does patient have a medical provider? Yes   Patient [facility] to Other (comment)  [facility]   Was a mental health screening completed? (GAINS tool) No   Does patient have dental issues? No   Does patient have vision issues? No   Does your patient have an abnormal blood pressure today? No   Since previous encounter, have you referred patient for abnormal blood pressure that resulted in a new diagnosis or medication change? No   Does your patient have an abnormal blood glucose today? No   Since previous encounter, have you referred patient for abnormal blood glucose that resulted in a new diagnosis or medication change? No   Was there a life-saving intervention made? 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

## 2015-12-25 DIAGNOSIS — F418 Other specified anxiety disorders: Secondary | ICD-10-CM | POA: Diagnosis not present

## 2015-12-28 DIAGNOSIS — Z3009 Encounter for other general counseling and advice on contraception: Secondary | ICD-10-CM | POA: Diagnosis not present

## 2016-01-15 NOTE — Congregational Nurse Program (Signed)
Congregational Nurse Program Note  Date of Encounter: 12/24/2015  Past Medical History: Past Medical History:  Diagnosis Date  . Anxiety   . Depression   . ZHYQMVHQ(469.6Headache(784.0)     Encounter Details:     CNP Questionnaire - 12/24/15 0140      Patient Demographics   Is this a new or existing patient? Existing   Patient is considered a/an Refugee   Race Asian     Patient Assistance   [facility]   Patient's financial/insurance status [facility] Income;[facility]   Uninsured Patient [facility]) Yes   Interventions Not Applicable   Patient referred to apply for the following financial assistance Not Applicable   Food insecurities addressed Not Applicable   Transportation assistance No   Assistance securing medications No   Educational health offerings Behavioral health;Navigating the healthcare system;Spiritual care     Encounter Details   Primary purpose of visit Education/Health Concerns;Spiritual Care/Support Visit   Was an Emergency Department visit averted? Not Applicable   Does patient have a medical provider? Yes   Patient referred to --  [facility]   Was a mental health screening completed? (GAINS tool) No   Does patient have dental issues? No   Does patient have vision issues? No   Does your patient have an abnormal blood pressure today? No   Since previous encounter, have you referred patient for abnormal blood pressure that resulted in a new diagnosis or medication change? No   Does your patient have an abnormal blood glucose today? No   Since previous encounter, have you referred patient for abnormal blood glucose that resulted in a new diagnosis or medication change? No   Was there a life-saving intervention made? No      Client seen briefly , has been stressed more lately.  Was not able to discuss more, will followup

## 2016-03-08 ENCOUNTER — Other Ambulatory Visit: Payer: Self-pay | Admitting: Obstetrics and Gynecology

## 2016-03-08 ENCOUNTER — Other Ambulatory Visit (HOSPITAL_COMMUNITY)
Admission: RE | Admit: 2016-03-08 | Discharge: 2016-03-08 | Disposition: A | Discharge status: Home / Self Care | Payer: BLUE CROSS/BLUE SHIELD | Source: Ambulatory Visit | Attending: Obstetrics and Gynecology | Admitting: Obstetrics and Gynecology

## 2016-03-08 DIAGNOSIS — Z01419 Encounter for gynecological examination (general) (routine) without abnormal findings: Secondary | ICD-10-CM | POA: Insufficient documentation

## 2016-03-08 DIAGNOSIS — Z1151 Encounter for screening for human papillomavirus (HPV): Secondary | ICD-10-CM | POA: Diagnosis not present

## 2016-03-09 NOTE — Congregational Nurse Program (Signed)
Congregational Nurse Program Note  Date of Encounter: 02/18/2016  Past Medical History: Past Medical History:  Diagnosis Date  . Anxiety   . Depression   . ZOXWRUEA(540.9Headache(784.0)     Encounter Details:     CNP Questionnaire - 02/18/16 0049      Patient Demographics   Is this a new or existing patient? Existing   Patient is considered a/an Refugee   Race Asian     Patient Assistance   [facility]   Patient's financial/insurance status Low Income;[facility]   Uninsured Patient [facility]) Yes   Interventions Not Applicable   Patient referred to apply for the following financial assistance Not Applicable   Food insecurities addressed Not Applicable   Transportation assistance No   Assistance securing medications No   Educational health offerings Navigating the healthcare system;Other     Encounter Details   Primary purpose of visit Family/Caregiver Support   Was an Emergency Department visit averted? Not Applicable   Does patient have a medical provider? Yes   Patient referred to Not Applicable  [facility]   Was a mental health screening completed? (GAINS tool) No   Does patient have dental issues? No   Does patient have vision issues? No   Does your patient have an abnormal blood pressure today? No   Since previous encounter, have you referred patient for abnormal blood pressure that resulted in a new diagnosis or medication change? No   Does your patient have an abnormal blood glucose today? No   Since previous encounter, have you referred patient for abnormal blood glucose that resulted in a new diagnosis or medication change? No   Was there a life-saving intervention made? No    Shared resource in the community that would let children pick out gifts for family and have them wrapped.  Client interested.  .Marland Kitchen

## 2016-03-11 LAB — CYTOLOGY - PAP
DIAGNOSIS: NEGATIVE
HPV: NOT DETECTED

## 2016-03-15 DIAGNOSIS — N3000 Acute cystitis without hematuria: Secondary | ICD-10-CM | POA: Diagnosis not present

## 2016-04-06 DIAGNOSIS — Z30433 Encounter for removal and reinsertion of intrauterine contraceptive device: Secondary | ICD-10-CM | POA: Diagnosis not present

## 2016-04-06 DIAGNOSIS — Z3202 Encounter for pregnancy test, result negative: Secondary | ICD-10-CM | POA: Diagnosis not present
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
## 2016-05-31 DIAGNOSIS — Z30431 Encounter for routine checking of intrauterine contraceptive device: Secondary | ICD-10-CM | POA: Diagnosis not present

## 2016-05-31 DIAGNOSIS — N898 Other specified noninflammatory disorders of vagina: Secondary | ICD-10-CM | POA: Diagnosis not present

## 2016-06-29 DIAGNOSIS — F418 Other specified anxiety disorders: Secondary | ICD-10-CM | POA: Diagnosis not present

## 2017-01-04 DIAGNOSIS — M7989 Other specified soft tissue disorders: Secondary | ICD-10-CM | POA: Diagnosis not present

## 2017-01-04 DIAGNOSIS — E781 Pure hyperglyceridemia: Secondary | ICD-10-CM | POA: Diagnosis not present

## 2017-01-04 DIAGNOSIS — Z0000 Encounter for general adult medical examination without abnormal findings: Secondary | ICD-10-CM | POA: Diagnosis not present

## 2017-01-04 DIAGNOSIS — F418 Other specified anxiety disorders: Secondary | ICD-10-CM | POA: Diagnosis not present

## 2017-03-16 DIAGNOSIS — Z01411 Encounter for gynecological examination (general) (routine) with abnormal findings: Secondary | ICD-10-CM | POA: Diagnosis not present

## 2017-03-16 DIAGNOSIS — N760 Acute vaginitis: Secondary | ICD-10-CM | POA: Diagnosis not present

## 2017-04-19 DIAGNOSIS — Z23 Encounter for immunization: Secondary | ICD-10-CM | POA: Diagnosis not present

## 2017-04-25 DIAGNOSIS — Z111 Encounter for screening for respiratory tuberculosis: Secondary | ICD-10-CM | POA: Diagnosis not present

## 2017-05-01 DIAGNOSIS — Z111 Encounter for screening for respiratory tuberculosis: Secondary | ICD-10-CM | POA: Diagnosis not present

## 2017-07-14 DIAGNOSIS — F418 Other specified anxiety disorders: Secondary | ICD-10-CM | POA: Diagnosis not present

## 2017-07-14 DIAGNOSIS — E781 Pure hyperglyceridemia: Secondary | ICD-10-CM | POA: Diagnosis not present

## 2018-01-04 DIAGNOSIS — Z0000 Encounter for general adult medical examination without abnormal findings: Secondary | ICD-10-CM | POA: Diagnosis not present

## 2018-01-04 DIAGNOSIS — E781 Pure hyperglyceridemia: Secondary | ICD-10-CM | POA: Diagnosis not present

## 2018-01-04 DIAGNOSIS — R202 Paresthesia of skin: Secondary | ICD-10-CM | POA: Diagnosis not present

## 2018-03-02 ENCOUNTER — Encounter (HOSPITAL_COMMUNITY): Payer: Self-pay

## 2018-03-02 ENCOUNTER — Emergency Department (HOSPITAL_COMMUNITY): Payer: BLUE CROSS/BLUE SHIELD

## 2018-03-02 ENCOUNTER — Emergency Department (HOSPITAL_COMMUNITY)
Admission: EM | Admit: 2018-03-02 | Discharge: 2018-03-03 | Disposition: A | Discharge status: Home / Self Care | Payer: BLUE CROSS/BLUE SHIELD | Attending: Emergency Medicine | Admitting: Emergency Medicine

## 2018-03-02 DIAGNOSIS — F329 Major depressive disorder, single episode, unspecified: Secondary | ICD-10-CM | POA: Insufficient documentation

## 2018-03-02 DIAGNOSIS — R079 Chest pain, unspecified: Secondary | ICD-10-CM | POA: Diagnosis not present

## 2018-03-02 DIAGNOSIS — F32A Depression, unspecified: Secondary | ICD-10-CM

## 2018-03-02 DIAGNOSIS — R0602 Shortness of breath: Secondary | ICD-10-CM | POA: Diagnosis not present

## 2018-03-02 LAB — CBC
HCT: 39.3 % (ref 36.0–46.0)
HEMOGLOBIN: 12.1 g/dL (ref 12.0–15.0)
MCH: 24.4 pg — AB (ref 26.0–34.0)
MCHC: 30.8 g/dL (ref 30.0–36.0)
MCV: 79.4 fL — ABNORMAL LOW (ref 80.0–100.0)
PLATELETS: 307 10*3/uL (ref 150–400)
RBC: 4.95 MIL/uL (ref 3.87–5.11)
RDW: 13 % (ref 11.5–15.5)
WBC: 12.5 10*3/uL — ABNORMAL HIGH (ref 4.0–10.5)
nRBC: 0 % (ref 0.0–0.2)

## 2018-03-02 LAB — BASIC METABOLIC PANEL
Anion gap: 6 (ref 5–15)
BUN: 5 mg/dL — AB (ref 6–20)
CALCIUM: 9.7 mg/dL (ref 8.9–10.3)
CO2: 28 mmol/L (ref 22–32)
CREATININE: 0.58 mg/dL (ref 0.44–1.00)
Chloride: 103 mmol/L (ref 98–111)
GFR calc Af Amer: >60 mL/min (ref >60)
GLUCOSE: 101 mg/dL — AB (ref 70–99)
Potassium: 4.3 mmol/L (ref 3.5–5.1)
Sodium: 137 mmol/L (ref 135–145)

## 2018-03-02 LAB — I-STAT BETA HCG BLOOD, ED (MC, WL, AP ONLY): I-stat hCG, quantitative: <5 m[IU]/mL (ref <5)

## 2018-03-02 NOTE — ED Triage Notes (Signed)
Pt here for centralized chest pain for 2 weeks, worse with palpation. Pt denies any other symptoms.

## 2018-03-03 LAB — I-STAT TROPONIN, ED: TROPONIN I, POC: 0 ng/mL (ref 0.00–0.08)

## 2018-03-03 LAB — TSH: TSH: 7.023 u[IU]/mL — ABNORMAL HIGH (ref 0.350–4.500)

## 2018-03-03 NOTE — ED Provider Notes (Signed)
[facility] Provider Note   CSN: [id number] Arrival date & time: 03/02/18  1845     History   Chief Complaint Chief Complaint  Patient presents with  . Chest Pain    HPI Connie Frazier is a 37 y.o. female.  HPI Patient presents with a few different problems.  States she has been having chest pain for mid chest going through the back.  States that was coming on but is been there for months and up to years.  No shortness of breath.  No fevers.  Does not come on with exertion.  Also has had both weight loss and weight gain per the patient.  Also history of depression.  Worse recently.  Also some anxiety.  No suicidal homicidal thoughts.  On sertraline by her PCP.  No abdominal pain.  No swelling in her legs.  No trouble breathing. Past Medical History:  Diagnosis Date  . Anxiety   . Depression   . Headache(784.0)     There are no active problems to display for this patient.   Past Surgical History:  Procedure Laterality Date  . CESAREAN SECTION  09/30/2010   Procedure: CESAREAN SECTION;  Surgeon: Jessee Aversara J. Cole;  Location: [facility];  Service: Gynecology;  Laterality: N/A;  Primary Cesarean Section  . NO PAST SURGERIES       OB History    Gravida  2   Para  2   Term  2   Preterm      AB      Living  2     SAB      TAB      Ectopic      Multiple      Live Births  1            Home Medications    Prior to Admission medications   Medication Sig Start Date End Date Taking? Authorizing Provider  nitrofurantoin, macrocrystal-monohydrate, (MACROBID) 100 MG capsule Take 1 capsule (100 mg total) by mouth 2 (two) times daily. 07/27/13   Presson, Mathis FareJennifer Lee H, PA    Family History No family history on file.  Social History Social History   Tobacco Use  . Smoking status: Never Smoker  Substance Use Topics  . Alcohol use: Yes    Comment: rarely  . Drug use: No     Allergies   Patient has no known  allergies.   Review of Systems Review of Systems  Constitutional: Positive for appetite change and unexpected weight change.  Respiratory: Negative for shortness of breath.   Cardiovascular: Positive for chest pain.  Gastrointestinal: Negative for abdominal pain.  Genitourinary: Negative for flank pain.  Musculoskeletal: Positive for back pain.  Skin: Negative for rash.  Neurological: Negative for weakness.  Psychiatric/Behavioral: Negative for decreased concentration.     Physical Exam Updated Vital Signs BP 123/84   Pulse 78   Temp 98.2 F (36.8 C) (Oral)   Resp (!) 21   SpO2 100%   Physical Exam Neck:     Musculoskeletal: Neck supple.     Thyroid: No thyromegaly.  Cardiovascular:     Rate and Rhythm: Normal rate and regular rhythm.     Heart sounds: Normal heart sounds.  Pulmonary:     Effort: No tachypnea.     Breath sounds: No wheezing or rhonchi.  Neurological:     Mental Status: She is alert.      ED Treatments / Results  Labs (all labs  ordered are listed, but only abnormal results are displayed) Labs Reviewed  BASIC METABOLIC PANEL - Abnormal; Notable for the following components:      Result Value   Glucose, Bld 101 (*)    BUN 5 (*)    All other components within normal limits  CBC - Abnormal; Notable for the following components:   WBC 12.5 (*)    MCV 79.4 (*)    MCH 24.4 (*)    All other components within normal limits  TSH  I-STAT TROPONIN, ED  I-STAT BETA HCG BLOOD, ED ([facility], [facility], [facility] ONLY)  I-STAT BETA HCG BLOOD, ED ([facility], [facility], [facility] ONLY)    EKG EKG Interpretation  Date/Time:  Saturday March 03 2018 03:43:32 EST Ventricular Rate:  86 PR Interval:    QRS Duration: 86 QT Interval:  374 QTC Calculation: 448 R Axis:   81 Text Interpretation:  Sinus rhythm Confirmed by Benjiman CorePickering, Jazman Reuter [phone]([id number]) on 03/03/2018 5:05:55 AM   Radiology Dg Chest 2 View  Result Date: 03/02/2018 CLINICAL DATA:  Chest pain, shortness of breath. EXAM: CHEST - 2  VIEW COMPARISON:  None. FINDINGS: The heart size and mediastinal contours are within normal limits. Both lungs are clear. No pneumothorax or pleural effusion is noted. The visualized skeletal structures are unremarkable. IMPRESSION: No active cardiopulmonary disease. Electronically Signed   By: Lupita RaiderJames  Green Jr, M.D.   On: 03/02/2018 19:49    Procedures Procedures (including critical care time)  Medications Ordered in ED Medications - No data to display   Initial Impression / Assessment and Plan / ED Course  I have reviewed the triage vital signs and the nursing notes.  Pertinent labs & imaging results that were available during my care of the patient were reviewed by me and considered in my medical decision making (see chart for details).     Patient with chest pain.  Weight loss and weight gain per patient.  Feeling bad.  EKG and x-ray reassuring.  Does have worsening depression without suicidal thoughts.  I think she would benefit from outpatient management.  Discharge home.  Final Clinical Impressions(s) / ED Diagnoses   Final diagnoses:  Nonspecific chest pain  Depression, unspecified depression type    ED Discharge Orders    None       Benjiman CorePickering, Dameisha Tschida, MD 03/03/18 [phone]

## 2018-03-03 NOTE — Discharge Instructions (Addendum)
You have a TSH that was collected but has not returned result yet.  That can be followed by her doctor.  Follow-up with the resources given for the depression and changes in mood.

## 2018-03-03 NOTE — ED Notes (Signed)
Patient verbalizes understanding of discharge instructions. Opportunity for questioning and answers were provided. Armband removed by staff, pt discharged from ED ambulatory.   

## 2018-03-12 DIAGNOSIS — D72829 Elevated white blood cell count, unspecified: Secondary | ICD-10-CM | POA: Diagnosis not present

## 2018-03-12 DIAGNOSIS — R7989 Other specified abnormal findings of blood chemistry: Secondary | ICD-10-CM | POA: Diagnosis not present

## 2018-03-12 DIAGNOSIS — R946 Abnormal results of thyroid function studies: Secondary | ICD-10-CM | POA: Diagnosis not present

## 2018-03-13 DIAGNOSIS — Z23 Encounter for immunization: Secondary | ICD-10-CM | POA: Diagnosis not present

## 2019-06-20 ENCOUNTER — Ambulatory Visit: Payer: Medicaid Other | Attending: Internal Medicine

## 2019-06-20 DIAGNOSIS — Z23 Encounter for immunization: Secondary | ICD-10-CM

## 2019-06-20 NOTE — Progress Notes (Signed)
   Covid-19 Vaccination Clinic  Name:  Lubertha Leite    MRN: [id number] DOB: September 07, 1980  06/20/2019  Ms. Knippenberg was observed post Covid-19 immunization for 15 minutes without incident. She was provided with Vaccine Information Sheet and instruction to access the V-Safe system.   Ms. Myles was instructed to call 911 with any severe reactions post vaccine: Marland Kitchen Difficulty breathing  . Swelling of face and throat  . A fast heartbeat  . A bad rash all over body  . Dizziness and weakness   Immunizations Administered    Name Date Dose VIS Date Route   Pfizer COVID-19 Vaccine 06/20/2019 10:14 AM 0.3 mL 02/15/2019 Intramuscular   Manufacturer: [employer], [employer]   Lot: [id number]   NDC: [address]-9

## 2019-07-15 ENCOUNTER — Ambulatory Visit: Payer: Medicaid Other | Attending: Internal Medicine

## 2019-07-15 DIAGNOSIS — Z23 Encounter for immunization: Secondary | ICD-10-CM

## 2019-07-15 NOTE — Progress Notes (Signed)
   Covid-19 Vaccination Clinic  Name:  Connie Frazier    MRN: [id number] DOB: August 31, 1980  07/15/2019  [name] was observed post Covid-19 immunization for 15 minutes without incident. She was provided with Vaccine Information Sheet and instruction to access the V-Safe system.   [name] was instructed to call 911 with any severe reactions post vaccine: Marland Kitchen Difficulty breathing  . Swelling of face and throat  . A fast heartbeat  . A bad rash all over body  . Dizziness and weakness   Immunizations Administered    Name Date Dose VIS Date Route   Pfizer COVID-19 Vaccine 07/15/2019  8:24 AM 0.3 mL 05/01/2018 Intramuscular   Manufacturer: [employer], [employer]   Lot: [id number]   NDC: [address]-5

## 2019-10-14 ENCOUNTER — Encounter (HOSPITAL_COMMUNITY): Payer: Self-pay | Admitting: Emergency Medicine

## 2019-10-14 ENCOUNTER — Emergency Department (HOSPITAL_COMMUNITY)
Admission: EM | Admit: 2019-10-14 | Discharge: 2019-10-14 | Disposition: A | Discharge status: Home / Self Care | Payer: 59 | Attending: Emergency Medicine | Admitting: Emergency Medicine

## 2019-10-14 ENCOUNTER — Emergency Department (HOSPITAL_COMMUNITY): Payer: 59

## 2019-10-14 ENCOUNTER — Other Ambulatory Visit: Payer: Self-pay

## 2019-10-14 DIAGNOSIS — Y998 Other external cause status: Secondary | ICD-10-CM | POA: Diagnosis not present

## 2019-10-14 DIAGNOSIS — Y9289 Other specified places as the place of occurrence of the external cause: Secondary | ICD-10-CM | POA: Insufficient documentation

## 2019-10-14 DIAGNOSIS — M542 Cervicalgia: Secondary | ICD-10-CM | POA: Diagnosis not present

## 2019-10-14 DIAGNOSIS — Y9389 Activity, other specified: Secondary | ICD-10-CM | POA: Insufficient documentation

## 2019-10-14 MED ORDER — NAPROXEN 500 MG PO TABS: 500.0000 mg | ORAL_TABLET | Freq: Two times a day (BID) | ORAL | 0 refills | Status: AC | PRN

## 2019-10-14 MED ORDER — METHOCARBAMOL 500 MG PO TABS: 500.0000 mg | ORAL_TABLET | Freq: Three times a day (TID) | ORAL | 0 refills | Status: AC | PRN
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
## 2021-08-19 NOTE — Congregational Nurse Program (Signed)
Client asked about how to get mammogram.  Will send info. sent mammogram information to client. Juliann Pulse, RN  Congregational Nurse  [phone]

## 2021-12-02 NOTE — Congregational Nurse Program (Signed)
Member has been encouraged to be independent from [place] and  has graduated from [employer].  She is sad to leave, but seems ok with it.  Vinnie Langton, RN, [facility], [phone]
